# Patient Record
Sex: Female | Born: 1955 | Race: White | Hispanic: No | Marital: Married | Smoking: Never smoker
Health system: Southern US, Community
[De-identification: ages and names within clinical notes are randomized; demographics above are authoritative.]

## PROBLEM LIST (undated history)

## (undated) DIAGNOSIS — K219 Gastro-esophageal reflux disease without esophagitis: Secondary | ICD-10-CM

## (undated) DIAGNOSIS — E119 Type 2 diabetes mellitus without complications: Secondary | ICD-10-CM

## (undated) DIAGNOSIS — E890 Postprocedural hypothyroidism: Secondary | ICD-10-CM

## (undated) DIAGNOSIS — I10 Essential (primary) hypertension: Secondary | ICD-10-CM

## (undated) HISTORY — DX: Postprocedural hypothyroidism: E89.0

## (undated) HISTORY — PX: THYROIDECTOMY: SHX17

## (undated) HISTORY — DX: Type 2 diabetes mellitus without complications: E11.9

## (undated) HISTORY — DX: Essential (primary) hypertension: I10

## (undated) HISTORY — DX: Gastro-esophageal reflux disease without esophagitis: K21.9

---

## 2000-10-06 ENCOUNTER — Other Ambulatory Visit: Admission: RE | Admit: 2000-10-06 | Discharge: 2000-10-06 | Payer: Self-pay | Admitting: Obstetrics and Gynecology

## 2001-03-22 ENCOUNTER — Encounter: Payer: Self-pay | Admitting: *Deleted

## 2001-03-22 ENCOUNTER — Ambulatory Visit (HOSPITAL_COMMUNITY): Admission: RE | Admit: 2001-03-22 | Discharge: 2001-03-22 | Payer: Self-pay | Admitting: *Deleted

## 2001-10-17 ENCOUNTER — Other Ambulatory Visit: Admission: RE | Admit: 2001-10-17 | Discharge: 2001-10-17 | Payer: Self-pay | Admitting: Obstetrics and Gynecology

## 2002-10-23 ENCOUNTER — Other Ambulatory Visit: Admission: RE | Admit: 2002-10-23 | Discharge: 2002-10-23 | Payer: Self-pay | Admitting: Obstetrics and Gynecology

## 2003-07-20 ENCOUNTER — Encounter: Admission: RE | Admit: 2003-07-20 | Discharge: 2003-07-20 | Payer: Self-pay | Admitting: Obstetrics and Gynecology

## 2003-09-28 ENCOUNTER — Encounter: Admission: RE | Admit: 2003-09-28 | Discharge: 2003-09-28 | Payer: Self-pay | Admitting: Obstetrics and Gynecology

## 2003-10-16 ENCOUNTER — Encounter (HOSPITAL_COMMUNITY): Admission: RE | Admit: 2003-10-16 | Discharge: 2004-01-14 | Payer: Self-pay | Admitting: Obstetrics and Gynecology

## 2003-10-19 ENCOUNTER — Ambulatory Visit (HOSPITAL_COMMUNITY): Admission: RE | Admit: 2003-10-19 | Discharge: 2003-10-19 | Payer: Self-pay | Admitting: Obstetrics and Gynecology

## 2003-11-23 ENCOUNTER — Other Ambulatory Visit: Admission: RE | Admit: 2003-11-23 | Discharge: 2003-11-23 | Payer: Self-pay | Admitting: Obstetrics and Gynecology

## 2005-02-06 ENCOUNTER — Other Ambulatory Visit: Admission: RE | Admit: 2005-02-06 | Discharge: 2005-02-06 | Payer: Self-pay | Admitting: Obstetrics and Gynecology

## 2005-02-16 ENCOUNTER — Encounter: Admission: RE | Admit: 2005-02-16 | Discharge: 2005-02-16 | Payer: Self-pay | Admitting: Obstetrics and Gynecology

## 2005-09-18 ENCOUNTER — Encounter: Admission: RE | Admit: 2005-09-18 | Discharge: 2005-09-18 | Payer: Self-pay | Admitting: Internal Medicine

## 2006-02-17 ENCOUNTER — Encounter: Admission: RE | Admit: 2006-02-17 | Discharge: 2006-02-17 | Payer: Self-pay | Admitting: Obstetrics and Gynecology

## 2006-07-16 ENCOUNTER — Ambulatory Visit (HOSPITAL_COMMUNITY): Admission: RE | Admit: 2006-07-16 | Discharge: 2006-07-16 | Payer: Self-pay | Admitting: Neurosurgery

## 2007-03-03 ENCOUNTER — Encounter: Admission: RE | Admit: 2007-03-03 | Discharge: 2007-03-03 | Payer: Self-pay | Admitting: *Deleted

## 2008-03-06 ENCOUNTER — Encounter: Admission: RE | Admit: 2008-03-06 | Discharge: 2008-03-06 | Payer: Self-pay | Admitting: Obstetrics and Gynecology

## 2009-03-08 ENCOUNTER — Encounter: Admission: RE | Admit: 2009-03-08 | Discharge: 2009-03-08 | Payer: Self-pay | Admitting: Obstetrics and Gynecology

## 2010-03-19 ENCOUNTER — Encounter: Admission: RE | Admit: 2010-03-19 | Discharge: 2010-03-19 | Payer: Self-pay | Admitting: Obstetrics and Gynecology

## 2010-06-01 ENCOUNTER — Encounter: Payer: Self-pay | Admitting: Internal Medicine

## 2010-06-01 ENCOUNTER — Encounter: Payer: Self-pay | Admitting: Obstetrics and Gynecology

## 2011-02-17 ENCOUNTER — Other Ambulatory Visit: Payer: Self-pay | Admitting: Obstetrics and Gynecology

## 2011-02-17 DIAGNOSIS — Z1231 Encounter for screening mammogram for malignant neoplasm of breast: Secondary | ICD-10-CM

## 2011-03-23 ENCOUNTER — Ambulatory Visit
Admission: RE | Admit: 2011-03-23 | Discharge: 2011-03-23 | Disposition: A | Discharge status: Home / Self Care | Payer: BC Managed Care – PPO | Source: Ambulatory Visit | Attending: Obstetrics and Gynecology | Admitting: Obstetrics and Gynecology

## 2011-03-23 DIAGNOSIS — Z1231 Encounter for screening mammogram for malignant neoplasm of breast: Secondary | ICD-10-CM

## 2011-05-12 LAB — HM PAP SMEAR

## 2012-02-09 LAB — HM MAMMOGRAPHY

## 2012-03-11 ENCOUNTER — Other Ambulatory Visit: Payer: Self-pay | Admitting: Obstetrics and Gynecology

## 2012-03-11 DIAGNOSIS — Z1231 Encounter for screening mammogram for malignant neoplasm of breast: Secondary | ICD-10-CM

## 2012-03-24 ENCOUNTER — Ambulatory Visit
Admission: RE | Admit: 2012-03-24 | Discharge: 2012-03-24 | Disposition: A | Discharge status: Home / Self Care | Payer: BC Managed Care – PPO | Source: Ambulatory Visit | Attending: Obstetrics and Gynecology | Admitting: Obstetrics and Gynecology

## 2012-03-24 DIAGNOSIS — Z1231 Encounter for screening mammogram for malignant neoplasm of breast: Secondary | ICD-10-CM

## 2012-09-26 ENCOUNTER — Ambulatory Visit: Payer: BC Managed Care – PPO | Admitting: Family Medicine

## 2012-09-26 LAB — LIPID PANEL
Cholesterol: 182 mg/dL (ref 0–200)
HDL: 31 mg/dL — ABNORMAL LOW (ref >39)
Total CHOL/HDL Ratio: 5.9 Ratio
Triglycerides: 522 mg/dL — ABNORMAL HIGH (ref <150)

## 2012-09-26 LAB — T4, FREE: Free T4: 1.3 ng/dL (ref 0.80–1.80)

## 2012-09-26 LAB — COMPLETE METABOLIC PANEL WITH GFR
ALT: 36 U/L — ABNORMAL HIGH (ref 0–35)
AST: 33 U/L (ref 0–37)
Albumin: 4.3 g/dL (ref 3.5–5.2)
Alkaline Phosphatase: 95 U/L (ref 39–117)
BUN: 10 mg/dL (ref 6–23)
CO2: 29 mEq/L (ref 19–32)
Calcium: 9.7 mg/dL (ref 8.4–10.5)
Chloride: 102 mEq/L (ref 96–112)
Creat: 0.66 mg/dL (ref 0.50–1.10)
GFR, Est African American: >89 mL/min
GFR, Est Non African American: >89 mL/min
Glucose, Bld: 146 mg/dL — ABNORMAL HIGH (ref 70–99)
Potassium: 4.2 mEq/L (ref 3.5–5.3)
Sodium: 138 mEq/L (ref 135–145)
Total Bilirubin: 0.5 mg/dL (ref 0.3–1.2)
Total Protein: 7.1 g/dL (ref 6.0–8.3)

## 2012-09-26 LAB — CBC WITH DIFFERENTIAL/PLATELET
Basophils Absolute: 0.1 10*3/uL (ref 0.0–0.1)
Basophils Relative: 1 % (ref 0–1)
Eosinophils Absolute: 1.1 10*3/uL — ABNORMAL HIGH (ref 0.0–0.7)
Eosinophils Relative: 12 % — ABNORMAL HIGH (ref 0–5)
HCT: 37 % (ref 36.0–46.0)
Hemoglobin: 13.1 g/dL (ref 12.0–15.0)
Lymphocytes Relative: 38 % (ref 12–46)
Lymphs Abs: 3.2 10*3/uL (ref 0.7–4.0)
MCH: 30.3 pg (ref 26.0–34.0)
MCHC: 35.4 g/dL (ref 30.0–36.0)
MCV: 85.6 fL (ref 78.0–100.0)
Monocytes Absolute: 0.6 10*3/uL (ref 0.1–1.0)
Monocytes Relative: 8 % (ref 3–12)
Neutro Abs: 3.6 10*3/uL (ref 1.7–7.7)
Neutrophils Relative %: 41 % — ABNORMAL LOW (ref 43–77)
Platelets: 308 10*3/uL (ref 150–400)
RBC: 4.32 MIL/uL (ref 3.87–5.11)
RDW: 13.4 % (ref 11.5–15.5)
WBC: 8.6 10*3/uL (ref 4.0–10.5)

## 2012-09-26 LAB — T3, FREE: T3, Free: 2.5 pg/mL (ref 2.3–4.2)

## 2012-09-26 LAB — TSH: TSH: 0.764 u[IU]/mL (ref 0.350–4.500)

## 2012-09-26 LAB — HEMOGLOBIN A1C
Hgb A1c MFr Bld: 6.7 % — ABNORMAL HIGH (ref <5.7)
Mean Plasma Glucose: 146 mg/dL — ABNORMAL HIGH (ref <117)

## 2012-09-26 LAB — MAGNESIUM: Magnesium: 2.2 mg/dL (ref 1.5–2.5)

## 2012-09-26 NOTE — Progress Notes (Signed)
Patient ID: Diana Mayer, female   DOB: 1955-05-16, 57 y.o.   MRN: [id number]   [name] is coming in for labs then we'll do her follow up.

## 2012-10-17 ENCOUNTER — Other Ambulatory Visit: Payer: Self-pay | Admitting: Family Medicine

## 2012-10-17 MED ORDER — ICOSAPENT ETHYL 1 G PO CAPS: 2.0000 | ORAL_CAPSULE | Freq: Two times a day (BID) | ORAL | Status: DC

## 2012-11-28 ENCOUNTER — Other Ambulatory Visit: Payer: Self-pay | Admitting: Family Medicine

## 2012-11-28 DIAGNOSIS — IMO0001 Reserved for inherently not codable concepts without codable children: Secondary | ICD-10-CM

## 2012-11-28 MED ORDER — CANAGLIFLOZIN 300 MG PO TABS: 300.0000 mg | ORAL_TABLET | Freq: Every day | ORAL | Status: DC

## 2012-11-28 MED ORDER — SITAGLIP PHOS-METFORMIN HCL ER 100-1000 MG PO TB24: 1.0000 | ORAL_TABLET | Freq: Every day | ORAL | Status: DC

## 2013-03-24 ENCOUNTER — Other Ambulatory Visit: Payer: Self-pay

## 2013-03-24 DIAGNOSIS — Z1231 Encounter for screening mammogram for malignant neoplasm of breast: Secondary | ICD-10-CM

## 2013-04-13 ENCOUNTER — Encounter: Payer: Self-pay | Admitting: *Deleted

## 2013-04-26 ENCOUNTER — Ambulatory Visit: Payer: BC Managed Care – PPO

## 2013-05-01 ENCOUNTER — Ambulatory Visit
Admission: RE | Admit: 2013-05-01 | Discharge: 2013-05-01 | Disposition: A | Discharge status: Home / Self Care | Payer: BC Managed Care – PPO | Source: Ambulatory Visit

## 2013-05-01 DIAGNOSIS — Z1231 Encounter for screening mammogram for malignant neoplasm of breast: Secondary | ICD-10-CM

## 2013-05-17 ENCOUNTER — Other Ambulatory Visit: Payer: Self-pay | Admitting: Family Medicine

## 2013-08-01 ENCOUNTER — Encounter: Payer: Self-pay | Admitting: *Deleted

## 2013-08-01 ENCOUNTER — Other Ambulatory Visit: Payer: Self-pay | Admitting: *Deleted

## 2013-08-01 DIAGNOSIS — I10 Essential (primary) hypertension: Secondary | ICD-10-CM

## 2013-08-01 DIAGNOSIS — K219 Gastro-esophageal reflux disease without esophagitis: Secondary | ICD-10-CM

## 2013-08-01 DIAGNOSIS — E119 Type 2 diabetes mellitus without complications: Secondary | ICD-10-CM

## 2013-08-01 DIAGNOSIS — E663 Overweight: Secondary | ICD-10-CM

## 2013-08-01 DIAGNOSIS — E1165 Type 2 diabetes mellitus with hyperglycemia: Principal | ICD-10-CM

## 2013-08-01 DIAGNOSIS — R5381 Other malaise: Secondary | ICD-10-CM

## 2013-08-01 DIAGNOSIS — IMO0001 Reserved for inherently not codable concepts without codable children: Secondary | ICD-10-CM

## 2013-08-01 DIAGNOSIS — E785 Hyperlipidemia, unspecified: Secondary | ICD-10-CM

## 2013-08-01 DIAGNOSIS — E890 Postprocedural hypothyroidism: Secondary | ICD-10-CM

## 2013-08-01 DIAGNOSIS — R5383 Other fatigue: Secondary | ICD-10-CM

## 2013-08-01 DIAGNOSIS — E039 Hypothyroidism, unspecified: Secondary | ICD-10-CM

## 2013-08-01 DIAGNOSIS — G4483 Primary cough headache: Secondary | ICD-10-CM | POA: Insufficient documentation

## 2013-08-01 HISTORY — DX: Gastro-esophageal reflux disease without esophagitis: K21.9

## 2013-08-01 HISTORY — DX: Postprocedural hypothyroidism: E89.0

## 2013-08-01 HISTORY — DX: Essential (primary) hypertension: I10

## 2013-08-01 HISTORY — DX: Type 2 diabetes mellitus without complications: E11.9

## 2013-08-02 ENCOUNTER — Other Ambulatory Visit: Payer: BC Managed Care – PPO

## 2013-08-17 ENCOUNTER — Ambulatory Visit: Payer: BC Managed Care – PPO | Admitting: Family Medicine

## 2013-08-18 ENCOUNTER — Other Ambulatory Visit: Payer: Self-pay | Admitting: Family Medicine

## 2013-08-19 ENCOUNTER — Other Ambulatory Visit: Payer: Self-pay | Admitting: Family Medicine

## 2013-08-19 DIAGNOSIS — IMO0001 Reserved for inherently not codable concepts without codable children: Secondary | ICD-10-CM

## 2013-08-19 DIAGNOSIS — I10 Essential (primary) hypertension: Secondary | ICD-10-CM

## 2013-08-19 DIAGNOSIS — E1165 Type 2 diabetes mellitus with hyperglycemia: Principal | ICD-10-CM

## 2013-08-19 MED ORDER — NADOLOL 20 MG PO TABS: 20.0000 mg | ORAL_TABLET | Freq: Every day | ORAL | Status: DC

## 2013-08-19 MED ORDER — SITAGLIP PHOS-METFORMIN HCL ER 100-1000 MG PO TB24: 1.0000 | ORAL_TABLET | Freq: Every day | ORAL | Status: DC

## 2013-08-22 ENCOUNTER — Ambulatory Visit: Payer: BC Managed Care – PPO | Admitting: Family Medicine

## 2013-08-30 ENCOUNTER — Other Ambulatory Visit: Payer: BC Managed Care – PPO

## 2013-08-30 LAB — CBC WITH DIFFERENTIAL/PLATELET
Basophils Absolute: 0.1 10*3/uL (ref 0.0–0.1)
Basophils Relative: 1 % (ref 0–1)
Eosinophils Absolute: 1.1 10*3/uL — ABNORMAL HIGH (ref 0.0–0.7)
Eosinophils Relative: 12 % — ABNORMAL HIGH (ref 0–5)
HCT: 39.6 % (ref 36.0–46.0)
Hemoglobin: 14.6 g/dL (ref 12.0–15.0)
Lymphocytes Relative: 32 % (ref 12–46)
Lymphs Abs: 2.8 10*3/uL (ref 0.7–4.0)
MCH: 30.6 pg (ref 26.0–34.0)
MCHC: 36.9 g/dL — ABNORMAL HIGH (ref 30.0–36.0)
MCV: 83 fL (ref 78.0–100.0)
Monocytes Absolute: 0.7 10*3/uL (ref 0.1–1.0)
Monocytes Relative: 8 % (ref 3–12)
Neutro Abs: 4.2 10*3/uL (ref 1.7–7.7)
Neutrophils Relative %: 47 % (ref 43–77)
Platelets: 326 10*3/uL (ref 150–400)
RBC: 4.77 MIL/uL (ref 3.87–5.11)
RDW: 13.1 % (ref 11.5–15.5)
WBC: 8.9 10*3/uL (ref 4.0–10.5)

## 2013-08-30 LAB — COMPLETE METABOLIC PANEL WITH GFR
ALT: 20 U/L (ref 0–35)
AST: 25 U/L (ref 0–37)
Albumin: 4.3 g/dL (ref 3.5–5.2)
Alkaline Phosphatase: 80 U/L (ref 39–117)
BUN: 13 mg/dL (ref 6–23)
CO2: 26 mEq/L (ref 19–32)
Calcium: 9.2 mg/dL (ref 8.4–10.5)
Chloride: 103 mEq/L (ref 96–112)
Creat: 0.8 mg/dL (ref 0.50–1.10)
GFR, Est African American: >89 mL/min
GFR, Est Non African American: 82 mL/min
Glucose, Bld: 127 mg/dL — ABNORMAL HIGH (ref 70–99)
Potassium: 4.4 mEq/L (ref 3.5–5.3)
Sodium: 138 mEq/L (ref 135–145)
Total Bilirubin: 0.8 mg/dL (ref 0.2–1.2)
Total Protein: 6.9 g/dL (ref 6.0–8.3)

## 2013-08-30 LAB — LIPID PANEL
Cholesterol: 163 mg/dL (ref 0–200)
HDL: 35 mg/dL — ABNORMAL LOW (ref >39)
LDL Cholesterol: 66 mg/dL (ref 0–99)
Total CHOL/HDL Ratio: 4.7 Ratio
Triglycerides: 311 mg/dL — ABNORMAL HIGH (ref <150)
VLDL: 62 mg/dL — ABNORMAL HIGH (ref 0–40)

## 2013-08-30 LAB — T4, FREE: Free T4: 1.36 ng/dL (ref 0.80–1.80)

## 2013-08-30 LAB — TSH: TSH: 0.924 u[IU]/mL (ref 0.350–4.500)

## 2013-08-30 LAB — HEMOGLOBIN A1C
Hgb A1c MFr Bld: 6.9 % — ABNORMAL HIGH (ref <5.7)
Mean Plasma Glucose: 151 mg/dL — ABNORMAL HIGH (ref <117)

## 2013-09-06 ENCOUNTER — Encounter (INDEPENDENT_AMBULATORY_CARE_PROVIDER_SITE_OTHER): Payer: Self-pay

## 2013-09-06 ENCOUNTER — Ambulatory Visit (INDEPENDENT_AMBULATORY_CARE_PROVIDER_SITE_OTHER): Payer: BC Managed Care – PPO | Admitting: Family Medicine

## 2013-09-06 ENCOUNTER — Encounter: Payer: Self-pay | Admitting: Family Medicine

## 2013-09-06 VITALS — BP 124/85 | HR 78 | Resp 16 | Wt 162.0 lb

## 2013-09-06 DIAGNOSIS — E039 Hypothyroidism, unspecified: Secondary | ICD-10-CM

## 2013-09-06 DIAGNOSIS — I10 Essential (primary) hypertension: Secondary | ICD-10-CM

## 2013-09-06 DIAGNOSIS — E119 Type 2 diabetes mellitus without complications: Secondary | ICD-10-CM

## 2013-09-06 DIAGNOSIS — E781 Pure hyperglyceridemia: Secondary | ICD-10-CM

## 2013-09-06 MED ORDER — FENOFIBRATE MICRONIZED 130 MG PO CAPS: 130.0000 mg | ORAL_CAPSULE | Freq: Every day | ORAL | Status: AC

## 2013-09-06 MED ORDER — SITAGLIPTIN-METFORMIN HCL ER 50-1000 MG PO TB24
1.0000 | ORAL_TABLET | Freq: Two times a day (BID) | ORAL | Status: AC
Start: 2013-09-06 — End: 2014-09-07

## 2013-09-06 MED ORDER — OLMESARTAN-AMLODIPINE-HCTZ 40-5-25 MG PO TABS: ORAL_TABLET | ORAL | Status: DC

## 2013-09-06 MED ORDER — ICOSAPENT ETHYL 1 G PO CAPS: 2.0000 | ORAL_CAPSULE | Freq: Two times a day (BID) | ORAL | Status: DC

## 2013-09-06 MED ORDER — NADOLOL 20 MG PO TABS: 20.0000 mg | ORAL_TABLET | Freq: Every day | ORAL | Status: AC

## 2013-09-06 MED ORDER — LEVOTHYROXINE SODIUM 75 MCG PO TABS: 75.0000 ug | ORAL_TABLET | Freq: Every day | ORAL | Status: AC

## 2013-09-06 NOTE — Patient Instructions (Addendum)
1                                                                                                                                                                                                       )  BP - Perfect on your current dosage of Tribenzor and nadolol.      2)  Blood Sugar - Stop the Invokana and then finish up the Janumet XR 100/1000 then switch to the Janumet XR 50/1000 t wice a day.  You might consider reading the book by Dr. Monico HoarJoel Fuhrman "The End of Diabetes".      3 )  Cholesterol - Continue on the Vascepa 2 per day and add the fenofibrate with breakfast.  We'll recheck your labs in 6   months.     4)  Hypothyroidism - Your number is perfect on your current dosage so remain on it.     5 )  GERD - Add some Pepcid 20 - 40 mg or Zantac 150 - 300 mg to your Tums as needed.          Diabetes and Exercise Exercising regularly is impor tant. It is not just about losing weight. It has many health benefits, such as:  Improving your overall   fitness, flexibility, and endurance.  Increasing your bone  density.  Helping with weight c ontrol.  Decreasing your bod  y fat.  Increasing your mus cle strength.  Reducing stress an  d tension.  Improving your over all health. People with diabetes w  ho exercise gain additional benefits because exercise:  Reduces appetite.   Improves the body's use of  blood sugar (glucose).  Helps lower or control blo  od glucose.  Decreases blood pressur e.  Helps control blood lipids              Shaking.  Sweating.  Chills.  Conf   Drink plenty of water while you exercise to prevent dehydration or heat stroke. Body water is lost during exercise and must be replaced.  Talk to your health care provider before starting an exercise program to make sure it is safe for  you. Remember, almost any type of activity is better than none. Document Released: 07/18/2003 Document Revised: 12/28/2012 Document Reviewed: 10/04/2012 [facility] Patient Information 2014 [place].

## 2013-09-06 NOTE — Progress Notes (Signed)
Subjective:    Patient ID: Diana Mayer, female    DOB: Jan 06, 1956, 58 y.o.   MRN: [id number]  HPI   Diana Mayer is here for a follow up on Diana Mayer medications and most recent lab results.  1)  Hypothyroidism - Diana Mayer is feeling fine on Diana Mayer current dosage of 75 mcg.  Diana Mayer can not tell a difference between the generic levothyroxine vs Synthroid.  Diana Mayer needs a refill for the levothyroxine.    2)   Type II DM -  Diana Mayer is taking a combination of Invokana (1/2 of the 300 mg) and Janumet (100/1000 mg) daily.  Diana Mayer really does not like the Invokana because it give Diana Mayer vaginal irritation.    3)  Hypertension -  Diana Mayer blood pressure is controlled with the combination of Nadolol (20 mg daily) and Tribenzor (40-5-25 mg) 1/2 tab daily.   4)  Headaches - Diana Mayer says that they are much better than they were 1 1/2 years ago but Diana Mayer still has them.     Review of Systems  Constitutional: Negative for activity change, appetite change, fatigue and unexpected weight change.  HENT: Negative.   Eyes: Negative.   Respiratory: Negative for chest tightness and shortness of breath.   Cardiovascular: Negative for chest pain, palpitations and leg swelling.  Gastrointestinal: Negative for diarrhea and constipation.  Endocrine: Negative.  Negative for polydipsia, polyphagia and polyuria.  Genitourinary: Negative for urgency, frequency and difficulty urinating.  Musculoskeletal: Negative.   Skin: Negative.   Neurological: Negative.  Negative for light-headedness and numbness.  Hematological: Negative for adenopathy. Does not bruise/bleed easily.  Psychiatric/Behavioral: Negative for sleep disturbance and dysphoric mood. The patient is not nervous/anxious.        Diana Mayer has been very stressed this past year due to the unexpected death of Diana Mayer brother and Diana Mayer mother's failing health.       Past Medical History  Diagnosis Date  . Thyroid disease     HYPOTHYROIDISM  . Hypertension   . Diabetes mellitus without complication       Past Surgical History  Procedure Laterality Date  . 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 Thyroid disease Mother   . Cancer Maternal Aunt     Breast Cancer  . Cancer Maternal Grandmother     Breast Cancer  . Diabetes Paternal Grandmother   . Heart disease Brother     Allergies  Allergen Reactions  . Ace Inhibitors Cough     Immunization History  Administered Date(s) Administered  . Tdap 07/30/2011       Objective:   Physical Exam  Nursing note and vitals reviewed. Constitutional: Diana Mayer is oriented to person, place, and time.  Eyes: Conjunctivae are normal. No scleral icterus.  Neck: Neck supple. No thyromegaly present.  Cardiovascular: Normal rate, regular rhythm and normal heart sounds.   Pulmonary/Chest: Effort normal and breath sounds normal.  Musculoskeletal: Diana Mayer exhibits  no edema and no tenderness.  Neurological: Diana Mayer is alert and oriented to person, place, and time.  Skin: Skin is warm and dry.  Psychiatric: Diana Mayer has a normal mood and affect. Diana Mayer behavior is normal. Judgment and thought content normal.      Assessment & Plan:   Diana Mayer was seen today for medication refill.  Diagnoses and associated orders for this visit:  Type II or unspecified type diabetes mellitus without mention of complication, not stated as uncontrolled Comments: Diana Mayer A1c has increased from Diana Mayer last visit.  We discussed a couple of options for Diana Mayer to get better control of Diana Mayer blood sugar. Since Diana Mayer does not like the  Invokana, we'll stop this medication and increase Diana Mayer Janumet XR to 50/1000 mg twice a day.  We'll recheck Diana Mayer A1c in 6 months.  Diana Mayer might consider Victoza or Trulicity in 6 months if Diana Mayer A1c does not improve.    - SitaGLIPtin-MetFORMIN HCl (JANUMET XR) 50-1000 MG TB24; Take 1 tablet by mouth 2 (two) times daily.  Essential hypertension, benign Comments: Diana Mayer BP is perfect on Diana Mayer current combination of medications.   - Olmesartan-Amlodipine-HCTZ (TRIBENZOR) 40-5-25 MG TABS; Take 1/2 - 1 tab po daily - nadolol (CORGARD) 20 MG tablet; Take 1 tablet (20 mg total) by mouth daily.  Unspecified hypothyroidism Comments: Diana Mayer feels well on Diana Mayer current dosage of levothyroxine so Diana Mayer'll remain on Diana Mayer current dosage of 75 mcg.   - levothyroxine (SYNTHROID, LEVOTHROID) 75 MCG tablet; Take 1 tablet (75 mcg total) by mouth daily before breakfast.  High triglycerides Comments: Diana Mayer triglycerides are lower on the Vescepa but are still elevated so Diana Mayer is going to start on Antara.   - Icosapent Ethyl 1 G CAPS; Take 2 capsules by mouth 2 (two) times daily with a meal. - fenofibrate micronized (ANTARA) 130 MG capsule; Take 1 capsule (130 mg total) by mouth daily before breakfast.  TIME SPENT "FACE TO FACE" WITH PATIENT -  30 MINS

## 2013-10-10 ENCOUNTER — Encounter: Payer: Self-pay | Admitting: *Deleted

## 2013-10-17 ENCOUNTER — Encounter: Payer: Self-pay | Admitting: Cardiology

## 2013-10-17 ENCOUNTER — Ambulatory Visit (INDEPENDENT_AMBULATORY_CARE_PROVIDER_SITE_OTHER): Payer: BC Managed Care – PPO | Admitting: Cardiology

## 2013-10-17 VITALS — BP 126/76 | HR 80 | Ht 63.0 in | Wt 165.0 lb

## 2013-10-17 DIAGNOSIS — R0989 Other specified symptoms and signs involving the circulatory and respiratory systems: Secondary | ICD-10-CM

## 2013-10-17 DIAGNOSIS — E663 Overweight: Secondary | ICD-10-CM

## 2013-10-17 DIAGNOSIS — Z8249 Family history of ischemic heart disease and other diseases of the circulatory system: Secondary | ICD-10-CM

## 2013-10-17 DIAGNOSIS — R079 Chest pain, unspecified: Secondary | ICD-10-CM

## 2013-10-17 DIAGNOSIS — E890 Postprocedural hypothyroidism: Secondary | ICD-10-CM

## 2013-10-17 DIAGNOSIS — R0609 Other forms of dyspnea: Secondary | ICD-10-CM

## 2013-10-17 DIAGNOSIS — E785 Hyperlipidemia, unspecified: Secondary | ICD-10-CM

## 2013-10-17 DIAGNOSIS — I10 Essential (primary) hypertension: Secondary | ICD-10-CM

## 2013-10-17 NOTE — Patient Instructions (Addendum)
Your physician recommends that you continue on your current medications as directed. Please refer to the Current Medication list given to you today.  Your physician has requested that you have en exercise stress myoview. For further information please visit [email or web address] Please follow instruction sheet, as given.  You have been referred to [facility] PharmD here in our office-Lipid Clinic  Your physician recommends that you schedule a follow-up appointment in: PENDING YOUR TEST RESULTS

## 2013-10-17 NOTE — Progress Notes (Signed)
Patient ID: Diana Mayer, female   DOB: Aug 28, 1955, 58 y.o.   MRN: [id number]    Patient Name: Diana CopierKathy S Mayer Date of Encounter: 10/17/2013  Primary Care Provider:  Birdena JubileeZANARD, ROBYN, MD Primary Cardiologist:  Lars MassonKatarina H Manley Fason  Problem List   Past Medical History  Diagnosis Date  . Essential hypertension, benign 08/01/2013  . Esophageal reflux 08/01/2013  . Postsurgical hypothyroidism 08/01/2013  . Type II or unspecified type diabetes mellitus without mention of complication, not stated as uncontrolled 08/01/2013   Past Surgical History  Procedure Laterality Date  . Thyroidectomy      Allergies  Allergies  Allergen Reactions  . Ace Inhibitors Cough    HPI  58 year old female with prior medical history of known insulin-dependent diabetes mellitus, hyperlipidemia and hypertension, hypothyroidism and a significant family history of premature coronary artery disease who is coming with concerns of dyspnea on exertion chest pressure and overall fatigue that has been progressively worsening over the last couple months. The patient is specially on concerned since her father died of myocardial or infarction at age of 59 and her aunt had myocardial infarctions at age of 59. She has been recently started on fenofibrate for findings of triglycerides of over 300. Patient states that her chest pressure is retrosternal exertional associated shortness of breath and fatigue she denies any palpitations or syncope. She also denies any orthopnea paroxysmal nocturnal dyspnea or lower extremity edema.  Home Medications  Prior to Admission medications   Medication Sig Start Date End Date Taking? Authorizing Provider  fenofibrate micronized (ANTARA) 130 MG capsule Take 1 capsule (130 mg total) by mouth daily before breakfast. 09/06/13 09/07/14 Yes Gillian Scarceobyn K Zanard, MD  Icosapent Ethyl 1 G CAPS Take 2 capsules by mouth 2 (two) times daily with a meal. 09/06/13 09/07/14 Yes Gillian Scarceobyn K Zanard, MD  levothyroxine  (SYNTHROID, LEVOTHROID) 75 MCG tablet Take 1 tablet (75 mcg total) by mouth daily before breakfast. 09/06/13 09/07/14 Yes Gillian Scarceobyn K Zanard, MD  Olmesartan-Amlodipine-HCTZ [facility](TRIBENZOR) 40-5-25 MG TABS Take 1/2 - 1 tab po daily 09/06/13 09/07/14 Yes Gillian Scarceobyn K Zanard, MD  SitaGLIPtin-MetFORMIN HCl (JANUMET XR) 50-1000 MG TB24 Take 1 tablet by mouth 2 (two) times daily. 09/06/13 09/07/14 Yes Gillian Scarceobyn K Zanard, MD  nadolol (CORGARD) 20 MG tablet Take 1 tablet (20 mg total) by mouth daily. 09/06/13 10/06/13  Gillian Scarceobyn K Zanard, MD    Family History  Family History  Problem Relation Age of Onset  . Thyroid disease Mother   . Breast cancer Maternal Aunt   . Breast cancer Maternal Grandmother   . Diabetes Paternal Grandmother   . Heart disease Brother     Cardiac arrest    Social History  History   Social History  . Marital Status: Married    Spouse Name: N/A    Number of Children: 3  . Years of Education: N/A   Occupational History  . 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 Smoking status: Never Smoker   . Smokeless tobacco: Never Used  . Alcohol Use: No  . Drug Use: No  . Sexual Activity: Yes    Partners: Male   Other Topics Concern  . 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  Cardiovascular:  No chest pain, dyspnea on exertion, edema, orthopnea, palpitations, paroxysmal nocturnal dyspnea. Dermatological: No rash,  lesions/masses Respiratory: No cough, dyspnea Urologic: No hematuria, dysuria Abdominal:   No nausea, vomiting, diarrhea, bright red blood per rectum, melena, or hematemesis Neurologic:  No visual changes, wkns, changes in mental status. All other systems reviewed and are otherwise negative except as noted above.  Physical Exam  Blood pressure 126/76, pulse 80, height 5\' 3"  (1.6 m), weight 165 lb (74.844 kg).  General: Pleasant, NAD Psych: Normal affect. Neuro: Alert and oriented X 3. Moves all extremities spontaneously. HEENT: Normal  Neck: Supple without bruits or JVD. Lungs:  Resp regular and unlabored, CTA. Heart: RRR no s3, s4, or murmurs. Abdomen: Soft, non-tender, non-distended, BS + x 4.  Extremities: No clubbing, cyanosis or edema. DP/PT/Radials 2+ and equal bilaterally.  Labs:  No results found for this basename: CKTOTAL, CKMB, TROPONINI,  in the last 72 hours Lab Results  Component Value Date   WBC 8.9 08/30/2013   HGB 14.6 08/30/2013   HCT 39.6 08/30/2013   MCV 83.0 08/30/2013   PLT 326 08/30/2013    No results found for this basename: DDIMER   No components found with this basename: POCBNP,     Component Value Date/Time   NA 138 08/30/2013 0837   K 4.4 08/30/2013 0837   CL 103 08/30/2013 0837   CO2 26 08/30/2013 0837   GLUCOSE 127* 08/30/2013 0837   BUN 13 08/30/2013 0837   CREATININE 0.80 08/30/2013 0837   CALCIUM 9.2 08/30/2013 0837   PROT 6.9 08/30/2013 0837   ALBUMIN 4.3 08/30/2013 0837   AST 25 08/30/2013 0837   ALT 20 08/30/2013 0837   ALKPHOS 80 08/30/2013 0837   BILITOT 0.8 08/30/2013 0837   GFRNONAA 82 08/30/2013 0837   GFRAA >89 08/30/2013 0837   Lab Results  Component Value Date   CHOL 163 08/30/2013   HDL 35* 08/30/2013   LDLCALC 66 08/30/2013   TRIG 311* 08/30/2013    Accessory Clinical Findings  echocardiogram  ECG - SR, normal ECG    Assessment & Plan  58 year old female  1. exertional chest pain and dyspnea - with significant risk factors  that include premature coronary artery disease in multiple family members, non-insulin-dependent diabetes mellitus hypertension and significant hyperlipidemia. We will proceed with exercise nuclear stress test to rule out any ischemia. In case her stress test is negative it is crucial that patient with these risk factors specifically diabetes mellitus his father risk stratified for presence of any plaque in order to be able to tailor her lipid management.  2. Hyperlipidemia - triglycerides 311, recently started on fenofibrate, we'll refer the patient for further management to our lipid clinic.  3. blood pressure - well controlled  Followup in 6 weeks  Lars Masson, MD, [facility] 10/17/2013, 2:55 PM

## 2013-10-18 DIAGNOSIS — Z8249 Family history of ischemic heart disease and other diseases of the circulatory system: Secondary | ICD-10-CM | POA: Insufficient documentation

## 2013-10-18 DIAGNOSIS — E785 Hyperlipidemia, unspecified: Secondary | ICD-10-CM | POA: Insufficient documentation

## 2013-10-23 ENCOUNTER — Telehealth: Payer: Self-pay

## 2013-10-23 NOTE — Telephone Encounter (Signed)
[name] called to check on her synthroid refill, [facility] said they had sent it to us three different times, I gave her our fax number so she could make sure they have right number on file.

## 2013-10-31 ENCOUNTER — Ambulatory Visit: Payer: BC Managed Care – PPO | Admitting: Pharmacist

## 2013-11-01 ENCOUNTER — Ambulatory Visit (HOSPITAL_COMMUNITY): Payer: BC Managed Care – PPO | Attending: Cardiology | Admitting: Radiology

## 2013-11-01 VITALS — BP 115/84 | HR 78 | Ht 63.0 in | Wt 162.0 lb

## 2013-11-01 DIAGNOSIS — E890 Postprocedural hypothyroidism: Secondary | ICD-10-CM

## 2013-11-01 DIAGNOSIS — R0789 Other chest pain: Secondary | ICD-10-CM | POA: Insufficient documentation

## 2013-11-01 DIAGNOSIS — R079 Chest pain, unspecified: Secondary | ICD-10-CM

## 2013-11-01 DIAGNOSIS — R0609 Other forms of dyspnea: Secondary | ICD-10-CM | POA: Insufficient documentation

## 2013-11-01 DIAGNOSIS — E663 Overweight: Secondary | ICD-10-CM

## 2013-11-01 DIAGNOSIS — R0602 Shortness of breath: Secondary | ICD-10-CM

## 2013-11-01 DIAGNOSIS — R0989 Other specified symptoms and signs involving the circulatory and respiratory systems: Secondary | ICD-10-CM | POA: Insufficient documentation

## 2013-11-01 DIAGNOSIS — I10 Essential (primary) hypertension: Secondary | ICD-10-CM

## 2013-11-01 MED ORDER — TECHNETIUM TC 99M SESTAMIBI GENERIC - CARDIOLITE
11.0000 | Freq: Once | INTRAVENOUS | Status: AC | PRN
  Administered 2013-11-01: 11 via INTRAVENOUS

## 2013-11-01 MED ORDER — TECHNETIUM TC 99M SESTAMIBI GENERIC - CARDIOLITE
33.0000 | Freq: Once | INTRAVENOUS | Status: AC | PRN
  Administered 2013-11-01: 33 via INTRAVENOUS

## 2013-11-01 NOTE — Progress Notes (Signed)
[facility] SITE 3 NUCLEAR MED [address] [place], [place] [address] [phone]    Cardiology Nuclear Med Study  Toma CopierKathy S Gurney is a 58 y.o. female     MRN : [id number]     DOB: 1956/01/24  Procedure Date: 11/01/2013  Nuclear Med Background Indication for Stress Test:  Evaluation for Ischemia History: No prior known history of CAD  Cardiac Risk Factors: Family History - CAD, Hypertension, Lipids and NIDDM  Symptoms: Chest Pressure with exertion (last occurrence one month ago), DOE   Nuclear Pre-Procedure Caffeine/Decaff Intake:  8:00pm NPO After: 8:00pm   Lungs:  clear O2 Sat: 98% on room air. IV 0.9% NS with Angio Cath:  22g  IV Site: R Antecubital x 1, tolerated well IV Started by:  Irean HongPatsy Edwards, RN  Chest Size (in):  36 Cup Size: B  Height: 5\' 3"  (1.6 m)  Weight:  162 lb (73.483 kg)  BMI:  Body mass index is 28.7 kg/(m^2). Tech Comments:  Patient held po diabetic medication today. Irean HongPatsy Edwards, RN.    Nuclear Med Study 1 or 2 day study: 1 day  Stress Test Type:  Stress  Reading MD: N/A  Order Authorizing Provider:  Tobias AlexanderKatarina Nelson, MD  Resting Radionuclide: Technetium 4636m Sestamibi  Resting Radionuclide Dose: 11.0 mCi   Stress Radionuclide:  Technetium 2036m Sestamibi  Stress Radionuclide Dose: 33.0 mCi           Stress Protocol Rest HR: 78 Stress HR: 153  Rest BP: 115/84 Stress BP: 206/80  Exercise Time (min): 9:15 METS: 10.5   Predicted Max HR: 161 bpm % Max HR: 95.03 bpm Rate Pressure Product: [id number]   Dose of Adenosine (mg):  n/a Dose of Lexiscan: n/a mg  Dose of Atropine (mg): n/a Dose of Dobutamine: n/a mcg/kg/min (at max HR)  Stress Test Technologist: Irean HongPatsy Edwards, RN  Nuclear Technologist:  Doyne Keelonya Yount, CNMT     Rest Procedure:  Myocardial perfusion imaging was performed at rest 45 minutes following the intravenous administration of Technetium 3536m Sestamibi. Rest ECG: NSR - Normal EKG  Stress Procedure:  The patient exercised on the  treadmill utilizing the Bruce Protocol for 9:15 minutes, RPE=15. The patient stopped due to DOE and denied any chest pain.  There was a hypertensive response to exercise. Technetium 3836m Sestamibi was injected at peak exercise and myocardial perfusion imaging was performed after a brief delay. Stress ECG: Insignificant upsloping ST segment depression.  QPS Raw Data Images:  Normal; no motion artifact; normal heart/lung ratio. Stress Images:  Small fixed distal anteroseptal artifact Rest Images:  Small fixed distal anteroseptal artifact Subtraction (SDS):  No evidence of ischemia. Transient Ischemic Dilatation (Normal <1.22):  0.89 Lung/Heart Ratio (Normal <0.45):  0.25  Quantitative Gated Spect Images QGS EDV:  64 ml QGS ESV:  21 ml  Impression Exercise Capacity:  Excellent exercise capacity. BP Response:  Hypertensive blood pressure response. Clinical Symptoms:  There is dyspnea. ECG Impression:  Insignificant upsloping ST segment depression. Comparison with Prior Nuclear Study: No previous nuclear study performed  Overall Impression:  Low risk stress nuclear study with small fixed distal anteroseptal defect, likely artifact. No ischemia.  LV Ejection Fraction: 68%.  LV Wall Motion:  NL LV Function; NL Wall Motion  Chrystie NoseKenneth C. Hilty, MD, [facility] Center[facility]ACC Board Certified in Nuclear Cardiology Attending Cardiologist [facility]

## 2013-11-07 ENCOUNTER — Telehealth: Payer: Self-pay | Admitting: *Deleted

## 2013-11-07 MED ORDER — OLMESARTAN-AMLODIPINE-HCTZ 40-5-25 MG PO TABS: ORAL_TABLET | ORAL | Status: AC

## 2013-11-07 NOTE — Telephone Encounter (Signed)
Pt contacted about normal stress test no prior infarct or ischemia noted, however her BP was significantly elevated during the exercise portion of the test, so Dr NelsoDelton Seen recommends pt to start taking a whole pill of olmesartan-amlodipine-hctz 40-5-25mg  tabs instead of a 1/2.  Pt verbalized understanding and agrees with this plan.  Pt stated she has plenty of refills on this med, and will notify us when she needs a refill.  Updated med list accordingly.

## 2013-11-07 NOTE — Telephone Encounter (Signed)
Message copied by Loa SocksMARTIN, IVY M on Tue Nov 07, 2013  8:49 AM ------      Message from: Lars MassonNELSON, KATARINA H      Created: Mon Nov 06, 2013  5:37 PM       Normal stress test with no prior infarct or ischemia, however significantly increased blood pressure during exercise, I would therefore recommend to take a full pill of Olmesartan-Amlodipine-HCTZ (TRIBENZOR) 40-5-25 MG TABS. She is currently taking 1/2 pill.        ------

## 2013-11-14 ENCOUNTER — Ambulatory Visit (INDEPENDENT_AMBULATORY_CARE_PROVIDER_SITE_OTHER): Payer: BC Managed Care – PPO | Admitting: Pharmacist

## 2013-11-14 VITALS — Wt 163.0 lb

## 2013-11-14 DIAGNOSIS — E785 Hyperlipidemia, unspecified: Secondary | ICD-10-CM

## 2013-11-14 DIAGNOSIS — Z79899 Other long term (current) drug therapy: Secondary | ICD-10-CM

## 2013-11-14 MED ORDER — ATORVASTATIN CALCIUM 10 MG PO TABS: 10.0000 mg | ORAL_TABLET | Freq: Every day | ORAL | Status: DC

## 2013-11-14 NOTE — Progress Notes (Signed)
Patient is a pleasant 58 y.o. Female referred to lipid clinic by Dr. Delton SeeNelson.  She had a h/o diabetes and HTN, and had a brother recently have an MI at age 59 y.o.  She has a h/o elevated TG, and has been taking Vascepa 2 g/d.  She was recently started on fenofibrate a few months ago after TG > 300 mg/dL.  She has never taken a statin.  Her LDL is 66 mg/dL which is likely falsely low due to elevated TG, however her non-HDL is elevated at 128 mg/dL (goal < 161100).  Patient works late hours so diet can be erratic.  Patient tells me that carbohydrates are her weakness, and that she eats a lot of white starches.  She doesn't drink soda or alcohol, and drinks very little tea.  Drinks lots of water.  She is not very active, and walks maybe once weekly.  RF:  Diabetes, HTN, age, family h/o CAD - LDL goal < 70, non-HDL goal < 100 Meds:  Vascepa 2 g/d, Antara 130 mg daily  Family history:  Father died in MVA at 59 y.o. So don't know much about him, but his family appears to all have diabetes.  Her brother had an MI at 59 y.o. Social history:  Denies tobacco use.  Rarely drinks alcohol.  Labs:   08/2013:  TC 163, TG 311, LDL 66, non-HDL 128, HDL 35, LFTs normal (Vascepa 2 g/d at that time)  Current Outpatient Prescriptions  Medication Sig Dispense Refill  . fenofibrate micronized (ANTARA) 130 MG capsule Take 1 capsule (130 mg total) by mouth daily before breakfast.  30 capsule  5  . Icosapent Ethyl 1 G CAPS Take 2 capsules by mouth daily.      Marland Kitchen. levothyroxine (SYNTHROID, LEVOTHROID) 75 MCG tablet Take 1 tablet (75 mcg total) by mouth daily before breakfast.  30 tablet  11  . Olmesartan-Amlodipine-HCTZ 40-5-25 MG TABS TAKE 1 WHOLE PILL EVERY MORNING  30 tablet  6  . SitaGLIPtin-MetFORMIN HCl (JANUMET XR) 50-1000 MG TB24 Take 1 tablet by mouth 2 (two) times daily.  60 tablet  5  . nadolol (CORGARD) 20 MG tablet Take 1 tablet (20 mg total) by mouth daily.  30 tablet  11   No current facility-administered  medications for this visit.   Allergies  Allergen Reactions  . Ace Inhibitors Cough   Family History  Problem Relation Age of Onset  . Thyroid disease Mother   . Breast cancer Maternal Aunt   . Breast cancer Maternal Grandmother   . Diabetes Paternal Grandmother   . Heart disease Brother     Cardiac arrest

## 2013-11-14 NOTE — Patient Instructions (Signed)
Plan: 1.  Start generic lipitor 10 mg once daily (can take anytime) 2.  Move your fenofibrate (Anatara) 130 mg to the evening time and take after your evening meal 3.  Continue fish oil (Vascepa) 2 capsules daily. 4.  Will hope to stop fish oil or fenofibrate in 4 weeks once we get blood work back. 5.  Increase walking to 3 days per week. 6.  Limit white starches. 7.  Recheck cholesterol in 4 weeks (12/11/13 fasting labs - show up in AM anytime), and see Riki RuskJeremy 12/12/13 (3:30 pm)

## 2013-11-14 NOTE — Assessment & Plan Note (Signed)
Patient and I had discussion about cardiovascular risk reduction with statin in diabetic patients.  Her non-HDL is elevated, and LDL likely only < 70 given elevated TG (>300).  Would like to start low dose statin today, and may be able to stop either fenofibrate or Vascepa in the future.  She tells me she is taking fenofibrate on an empty stomach in the morning, so will change this to the evening after a meal to increase bioavailability.  Patient to work on limiting carbohydrates and increasing aerobic activity.  Since don't have repeat lipid since adding fenofibrate, we will recheck a lipid / liver in 4 weeks from now, and start lipitor 10 mg qd tonight.  She is agreeable to this, and she hopes to come off either fish oil or fibrate in 4 weeks.  Will see me 1 day after labs.

## 2013-12-11 ENCOUNTER — Other Ambulatory Visit (INDEPENDENT_AMBULATORY_CARE_PROVIDER_SITE_OTHER): Payer: BC Managed Care – PPO

## 2013-12-11 DIAGNOSIS — Z79899 Other long term (current) drug therapy: Secondary | ICD-10-CM

## 2013-12-11 DIAGNOSIS — E785 Hyperlipidemia, unspecified: Secondary | ICD-10-CM

## 2013-12-11 LAB — HEPATIC FUNCTION PANEL
ALT: 25 U/L (ref 0–35)
AST: 28 U/L (ref 0–37)
Albumin: 4.3 g/dL (ref 3.5–5.2)
Alkaline Phosphatase: 65 U/L (ref 39–117)
Bilirubin, Direct: 0.1 mg/dL (ref 0.0–0.3)
Total Bilirubin: 0.6 mg/dL (ref 0.2–1.2)
Total Protein: 7.5 g/dL (ref 6.0–8.3)

## 2013-12-11 LAB — LIPID PANEL
Cholesterol: 99 mg/dL (ref 0–200)
HDL: 34.3 mg/dL — ABNORMAL LOW (ref >39.00)
LDL Cholesterol: 35 mg/dL (ref 0–99)
NonHDL: 64.7
Total CHOL/HDL Ratio: 3
Triglycerides: 147 mg/dL (ref 0.0–149.0)
VLDL: 29.4 mg/dL (ref 0.0–40.0)

## 2013-12-12 ENCOUNTER — Ambulatory Visit (INDEPENDENT_AMBULATORY_CARE_PROVIDER_SITE_OTHER): Payer: BC Managed Care – PPO | Admitting: Pharmacist

## 2013-12-12 VITALS — Wt 164.0 lb

## 2013-12-12 DIAGNOSIS — E785 Hyperlipidemia, unspecified: Secondary | ICD-10-CM

## 2013-12-12 DIAGNOSIS — Z79899 Other long term (current) drug therapy: Secondary | ICD-10-CM

## 2013-12-12 NOTE — Patient Instructions (Signed)
1.  Stop Vascepa (fish oil) 2.  Make sure to take Antara (fenofibrate) with largest meal of the day. 3.  Continue generic lipitor 10 mg once daily 4.  Limit sugary drinks, carbohydrates, and desserts in diet. 5.  Start walking 4-5 days per week, with weight loss goal of 10 lbs over next 6 months. 6.  Recheck cholesterol and liver in 6 months (06/24/14 - fasting labs), and see Riki RuskJeremy the following day 06/25/14 at 3:30 pm

## 2013-12-12 NOTE — Progress Notes (Signed)
Patient is a pleasant 58 y.o. Female referred to lipid clinic by Dr. Delton SeeNelson.  She has a h/o diabetes and HTN, and had a brother recently have an MI at age 59 y.o.  She has a h/o elevated TG (>500), and has been taking Vascepa 2 g/d, Antara 130 mg qd, and Lipitor 10 mg qd.  Atorvastatin was started 4 weeks ago, and tolerating this well.  Her LDL is now down to 35 mg/dL and TG now < 409150 mg/qdL.     Patient works late hours so diet can be erratic.  Patient tells me that carbohydrates are her weakness, and that she eats a lot of white starches.  She doesn't drink soda or alcohol, and drinks very little tea.  Drinks lots of water.  She is not very active, and walks maybe once weekly.  RF:  Diabetes, HTN, age, family h/o CAD - LDL goal < 70, non-HDL goal < 100 Meds:  Vascepa 2 g/d, Antara 130 mg daily, Lipitor 10 mg qd  Family history:  Father died in MVA at 59 y.o. So don't know much about him, but his family appears to all have diabetes.  Her brother had an MI at 59 y.o. Social history:  Denies tobacco use.  Rarely drinks alcohol.  Labs:   11/2013:  TC 99, TG 147, LDL 35, non-HDL 65, HDL 34, LFTs normal (Vascepa 2 g/d, Antara 130 mg qd, Lipitor 10 mg qd) 08/2013:  TC 163, TG 311, LDL 66, non-HDL 128, HDL 35, LFTs normal (Vascepa 2 g/d at that time)  Current Outpatient Prescriptions  Medication Sig Dispense Refill  . atorvastatin (LIPITOR) 10 MG tablet Take 1 tablet (10 mg total) by mouth daily.  30 tablet  5  . fenofibrate micronized (ANTARA) 130 MG capsule Take 1 capsule (130 mg total) by mouth daily before breakfast.  30 capsule  5  . Icosapent Ethyl 1 G CAPS Take 2 capsules by mouth daily.      Marland Kitchen. levothyroxine (SYNTHROID, LEVOTHROID) 75 MCG tablet Take 1 tablet (75 mcg total) by mouth daily before breakfast.  30 tablet  11  . Olmesartan-Amlodipine-HCTZ 40-5-25 MG TABS TAKE 1 WHOLE PILL EVERY MORNING  30 tablet  6  . SitaGLIPtin-MetFORMIN HCl (JANUMET XR) 50-1000 MG TB24 Take 1 tablet by mouth 2 (two)  times daily.  60 tablet  5  . nadolol (CORGARD) 20 MG tablet Take 1 tablet (20 mg total) by mouth daily.  30 tablet  11   No current facility-administered medications for this visit.   Allergies  Allergen Reactions  . Ace Inhibitors Cough   Family History  Problem Relation Age of Onset  . Thyroid disease Mother   . Breast cancer Maternal Aunt   . Breast cancer Maternal Grandmother   . Diabetes Paternal Grandmother   . Heart disease Brother     Cardiac arrest

## 2013-12-12 NOTE — Assessment & Plan Note (Addendum)
Excellent drop in non-HDL and TG over past 4 weeks since adding fibrate and lipitor 10 mg qd.  Patient wants to try to get off meds if possible, which I feel is reasonable now.  Will continue lipitor given macrovascular benefit in diabetes, and will continue fenofibrate given h/o TG > 500 and for its microvascular benefit in diabetics.  Will have her d/c Vascepa today, and will recheck lipid / liver in 6 months.  Patient agrees to start walking and work on weight loss, in hopes that TG won't trend back up once off Vascepa.  If non-HDL and TG up in 6 months, will consider restarting fish oil or titrating lipitor at that time.   Plan: 1.  Stop Vascepa (fish oil) 2.  Make sure to take Antara (fenofibrate) with largest meal of the day. 3.  Continue generic lipitor 10 mg once daily 4.  Limit sugary drinks, carbohydrates, and desserts in diet. 5.  Start walking 4-5 days per week, with weight loss goal of 10 lbs over next 6 months. 6.  Recheck cholesterol and liver in 6 months (06/24/14 - fasting labs), and see Riki RuskJeremy the following day 06/25/14 at 3:30 pm

## 2013-12-17 ENCOUNTER — Other Ambulatory Visit: Payer: Self-pay | Admitting: Family Medicine

## 2013-12-17 DIAGNOSIS — I10 Essential (primary) hypertension: Secondary | ICD-10-CM

## 2013-12-17 MED ORDER — HYDROCHLOROTHIAZIDE 25 MG PO TABS: 25.0000 mg | ORAL_TABLET | Freq: Every day | ORAL | Status: AC

## 2013-12-27 ENCOUNTER — Other Ambulatory Visit: Payer: Self-pay | Admitting: Family Medicine

## 2014-06-25 ENCOUNTER — Ambulatory Visit: Payer: BC Managed Care – PPO | Admitting: Pharmacist

## 2014-06-25 ENCOUNTER — Other Ambulatory Visit: Payer: BC Managed Care – PPO

## 2014-06-26 ENCOUNTER — Ambulatory Visit: Payer: BLUE CROSS/BLUE SHIELD | Admitting: Pharmacist

## 2014-07-23 ENCOUNTER — Telehealth: Payer: Self-pay | Admitting: Cardiology

## 2014-07-23 ENCOUNTER — Other Ambulatory Visit (INDEPENDENT_AMBULATORY_CARE_PROVIDER_SITE_OTHER): Payer: BLUE CROSS/BLUE SHIELD | Admitting: *Deleted

## 2014-07-23 ENCOUNTER — Other Ambulatory Visit: Payer: Self-pay

## 2014-07-23 DIAGNOSIS — E785 Hyperlipidemia, unspecified: Secondary | ICD-10-CM

## 2014-07-23 DIAGNOSIS — Z1231 Encounter for screening mammogram for malignant neoplasm of breast: Secondary | ICD-10-CM

## 2014-07-23 DIAGNOSIS — Z79899 Other long term (current) drug therapy: Secondary | ICD-10-CM

## 2014-07-23 LAB — LIPID PANEL
Cholesterol: 117 mg/dL (ref 0–200)
HDL: 38.7 mg/dL — ABNORMAL LOW (ref >39.00)
LDL Cholesterol: 50 mg/dL (ref 0–99)
NonHDL: 78.3
Total CHOL/HDL Ratio: 3
Triglycerides: 144 mg/dL (ref 0.0–149.0)
VLDL: 28.8 mg/dL (ref 0.0–40.0)

## 2014-07-23 LAB — HEPATIC FUNCTION PANEL
ALT: 23 U/L (ref 0–35)
AST: 33 U/L (ref 0–37)
Albumin: 4.3 g/dL (ref 3.5–5.2)
Alkaline Phosphatase: 86 U/L (ref 39–117)
Bilirubin, Direct: 0.1 mg/dL (ref 0.0–0.3)
Total Bilirubin: 0.4 mg/dL (ref 0.2–1.2)
Total Protein: 7 g/dL (ref 6.0–8.3)

## 2014-07-23 MED ORDER — ATORVASTATIN CALCIUM 10 MG PO TABS: 10.0000 mg | ORAL_TABLET | Freq: Every day | ORAL | Status: AC

## 2014-07-23 NOTE — Telephone Encounter (Signed)
New Msg ° ° ° ° ° ° °Pt returning call. ° ° °Please call back. °

## 2014-07-23 NOTE — Telephone Encounter (Signed)
Informed the pt that per Dr Delton SeeNelson her lab results showed normal electrolytes and kidney function, and her triglycerides have significantly improved.  Clarification order obtained by Dr Delton SeeNelson in regards to the pt taking atorvastatin 10 mg po daily or increase this regimen.  Per Dr Delton SeeNelson this pt should remain on her current regimen of atorvastatin 10 mg po daily, due to guidelines stating that any pt over the age of 60 yrs old and with DM should be on a statin.  Pt requesting a refill on her atorvastatin.  Sent refill to pharmacy of choice.  Pt verbalized understanding and agrees with this plan.

## 2014-07-23 NOTE — Telephone Encounter (Signed)
-----   Message from Lars MassonKatarina H Nelson, MD sent at 07/23/2014  3:57 PM EDT ----- Normal electrolytes and kidney function, her triglycerides have improved significantly. However based on guideline she needs to be on statins as well - for any patients over 204 years old and with DM. I would add atorvastatin 10 mg po daily and recheck CMP in 1 month.

## 2014-07-23 NOTE — Addendum Note (Signed)
Addended by: Tonita PhoenixBOWDEN, ROBIN K on: 07/23/2014 08:36 AM   Modules accepted: Orders

## 2014-07-25 ENCOUNTER — Ambulatory Visit: Payer: BLUE CROSS/BLUE SHIELD | Admitting: Pharmacist

## 2014-08-03 ENCOUNTER — Encounter (INDEPENDENT_AMBULATORY_CARE_PROVIDER_SITE_OTHER): Payer: Self-pay

## 2014-08-03 ENCOUNTER — Ambulatory Visit
Admission: RE | Admit: 2014-08-03 | Discharge: 2014-08-03 | Disposition: A | Discharge status: Home / Self Care | Payer: BLUE CROSS/BLUE SHIELD | Source: Ambulatory Visit

## 2014-08-03 DIAGNOSIS — Z1231 Encounter for screening mammogram for malignant neoplasm of breast: Secondary | ICD-10-CM

## 2015-06-11 ENCOUNTER — Telehealth: Payer: Self-pay | Admitting: Cardiology

## 2015-06-11 NOTE — Telephone Encounter (Signed)
New message     Pt c/o of Chest Pain: STAT if CP now or developed within 24 hours  1. Are you having CP right now? no 2. Are you experiencing any other symptoms (ex. SOB, nausea, vomiting, sweating)? This am nausea and vomited several times, headache 3. How long have you been experiencing CP? Over the weekend pt had chest pain--she continued on with her day 4. Is your CP continuous or coming and going? Pt does not remember 5. Have you taken Nitroglycerin? No ?Her PCP suggest she call the cardiologist.  Pt did take a baby aspirin this am.  Her PCP thinks it may be viral but suggested she call us.

## 2015-06-11 NOTE — Telephone Encounter (Signed)
Pt calling in to our office, as instructed by her PCP, to inform Dr Delton See of her current symptoms:  Pt complains of having nausea, and vomiting several times this morning, while at work.  Pt states she had no chest pain, sob, DOE, palpitations, diaphoresis, pre-syncopal, or syncopal episodes during her "n/v episodes." Pt states that she did take a baby aspirin this morning, for months ago her PCP instructed her to take this daily for preventative measures.  Pt states there was no cardiac issues that warranted her to take the ASA.  Pt states that after she vomited several times at work, she started to feel a little light-headed and somewhat weak.  Pt states she had not eaten or drink anything at all this morning.  Pt did call her PCP for further recommendations.  Pt stated that her PCP asked about the same questions as I did, and she did mention that over the weekend, she had just a small twinge of chest pain, that lasted only seconds.  Pt states this was the only time this occurred.   Pts PCP seems to think that her symptoms are viral in nature, but advised that the pt follow-up with her Cardiologist to make [place] aware of her complaints.  Pt states her PCP informed her that she may need an EKG done. Pt states she is currently at home and resting comfortable.  Pt states she is completely asymptomatic at this time, just very thirsty and her stomach still hurts.  Noted in the pts chart that she is borderline diabetes type II.  Asked the pt if she has followed up on this.  Pt states she has not had her pre-diabetes followed up on, in quite sometime.  Pt states she does not have a blood sugar machine at home.  Pt states she hasn't been very compliant with having her pre-diabetes and her lipids followed.  Pt states she has not taken her cholesterol med in a long time.  Informed the pt that Dr Delton See is out of the office the rest of the day, and being she is not acute at this time, I advised her to contact her  PCP now and see if she can come in for an OV to check multiple systems.  Informed the pt that her PCP could check her BS (for that could have caused her issues), have her V/S taken, check needed follow-up labs, and possibly do an EKG if needed.  Informed the pt that her EKG can be faxed to our office, for Cardiologist to review, if her PCP did not feel comfortable interpreting this.  Informed the pt that I will most definitely make Dr Delton See aware of this conversation, and follow-up with her thereafter with any new recommendations she may provide.  Pt verbalized understanding and agrees with this plan.  Pt states she feels better right now, and doesn't feel like she needs to go to see her PCP or an Urgent Care, but she did verbalize that she will call her PCP today.

## 2015-06-12 NOTE — Telephone Encounter (Signed)
Please schedule her to see one of our PAs in 2 weeks, by then we should know if her symptoms were viral or cardiac, if she continues having symptoms, she should be scheduled for a stress test at that time.

## 2015-06-12 NOTE — Telephone Encounter (Signed)
Notified the pt that per Dr Nelson, she recommends that tDelton Seept be seen in 2 weeks with one of our PAs, for by then we should know if her symptoms were viral or cardiac, and if she continues having symptoms, then she could be scheduled for a stress test at that time.  Scheduled the pt with Herma Carson PA-C for 06/24/15 at 0845.  Pt verbalized understanding and agrees with this plan.  Pt gracious for all the assistance provided.  Will route this message to [facility], as an [place].

## 2015-06-24 ENCOUNTER — Ambulatory Visit: Payer: BLUE CROSS/BLUE SHIELD | Admitting: Physician Assistant

## 2015-09-16 ENCOUNTER — Other Ambulatory Visit: Payer: Self-pay

## 2015-09-16 DIAGNOSIS — Z1231 Encounter for screening mammogram for malignant neoplasm of breast: Secondary | ICD-10-CM

## 2015-09-30 ENCOUNTER — Ambulatory Visit
Admission: RE | Admit: 2015-09-30 | Discharge: 2015-09-30 | Disposition: A | Discharge status: Home / Self Care | Payer: BLUE CROSS/BLUE SHIELD | Source: Ambulatory Visit

## 2015-09-30 DIAGNOSIS — Z1231 Encounter for screening mammogram for malignant neoplasm of breast: Secondary | ICD-10-CM

## 2016-11-23 ENCOUNTER — Other Ambulatory Visit: Payer: Self-pay | Admitting: Family Medicine

## 2016-11-23 DIAGNOSIS — Z1231 Encounter for screening mammogram for malignant neoplasm of breast: Secondary | ICD-10-CM

## 2016-11-27 ENCOUNTER — Ambulatory Visit
Admission: RE | Admit: 2016-11-27 | Discharge: 2016-11-27 | Disposition: A | Discharge status: Home / Self Care | Payer: BLUE CROSS/BLUE SHIELD | Source: Ambulatory Visit | Attending: Family Medicine | Admitting: Family Medicine

## 2016-11-27 DIAGNOSIS — Z1231 Encounter for screening mammogram for malignant neoplasm of breast: Secondary | ICD-10-CM

## 2018-04-13 ENCOUNTER — Other Ambulatory Visit: Payer: Self-pay | Admitting: Family Medicine

## 2018-04-13 DIAGNOSIS — Z1231 Encounter for screening mammogram for malignant neoplasm of breast: Secondary | ICD-10-CM

## 2018-05-12 ENCOUNTER — Ambulatory Visit
Admission: RE | Admit: 2018-05-12 | Discharge: 2018-05-12 | Disposition: A | Discharge status: Home / Self Care | Payer: BLUE CROSS/BLUE SHIELD | Source: Ambulatory Visit | Attending: Family Medicine | Admitting: Family Medicine

## 2018-05-12 DIAGNOSIS — Z1231 Encounter for screening mammogram for malignant neoplasm of breast: Secondary | ICD-10-CM

## 2018-05-13 ENCOUNTER — Other Ambulatory Visit: Payer: Self-pay | Admitting: Family Medicine

## 2018-05-13 DIAGNOSIS — R928 Other abnormal and inconclusive findings on diagnostic imaging of breast: Secondary | ICD-10-CM

## 2018-05-18 ENCOUNTER — Ambulatory Visit: Payer: BLUE CROSS/BLUE SHIELD

## 2018-05-18 ENCOUNTER — Ambulatory Visit
Admission: RE | Admit: 2018-05-18 | Discharge: 2018-05-18 | Disposition: A | Discharge status: Home / Self Care | Payer: BLUE CROSS/BLUE SHIELD | Source: Ambulatory Visit | Attending: Family Medicine | Admitting: Family Medicine

## 2018-05-18 DIAGNOSIS — R928 Other abnormal and inconclusive findings on diagnostic imaging of breast: Secondary | ICD-10-CM

## 2019-06-16 ENCOUNTER — Other Ambulatory Visit: Payer: Self-pay | Admitting: Family Medicine

## 2019-06-16 DIAGNOSIS — Z1231 Encounter for screening mammogram for malignant neoplasm of breast: Secondary | ICD-10-CM

## 2019-07-12 ENCOUNTER — Ambulatory Visit: Payer: BLUE CROSS/BLUE SHIELD

## 2019-08-02 ENCOUNTER — Other Ambulatory Visit: Payer: Self-pay

## 2019-08-02 ENCOUNTER — Ambulatory Visit
Admission: RE | Admit: 2019-08-02 | Discharge: 2019-08-02 | Disposition: A | Discharge status: Home / Self Care | Payer: BC Managed Care – PPO | Source: Ambulatory Visit | Attending: Family Medicine | Admitting: Family Medicine

## 2019-08-02 DIAGNOSIS — Z1231 Encounter for screening mammogram for malignant neoplasm of breast: Secondary | ICD-10-CM

## 2020-07-21 IMAGING — MG DIGITAL SCREENING BILATERAL MAMMOGRAM WITH CAD
4 series · 4 of 4 positions shown · non-contrast
Comparison: Previous exam(s).

CLINICAL DATA: Screening.

EXAM:
DIGITAL SCREENING BILATERAL MAMMOGRAM WITH CAD

[R CC]
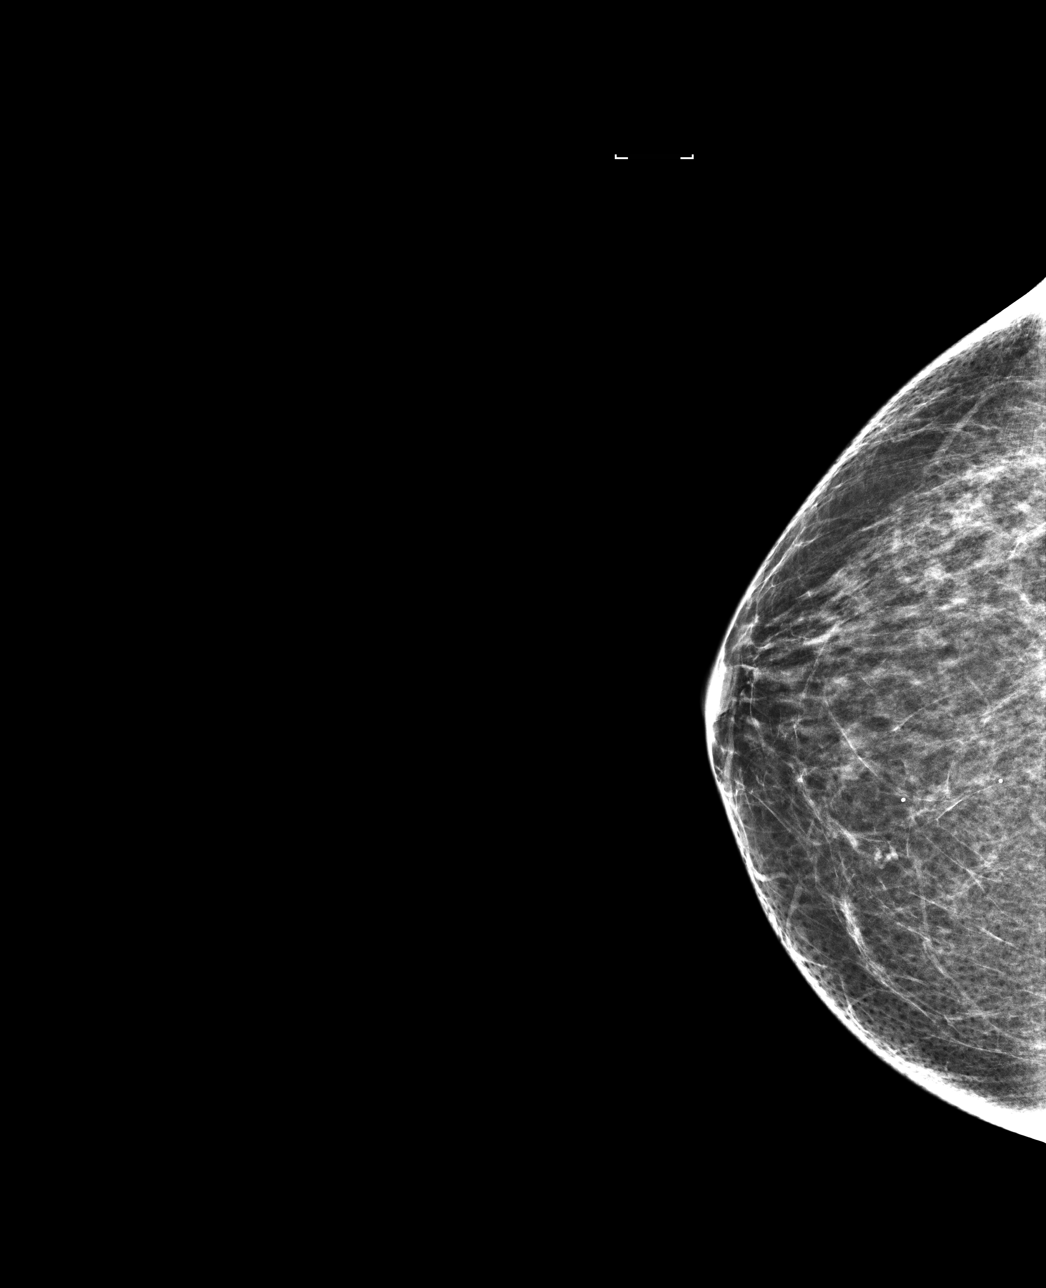

[L CC]
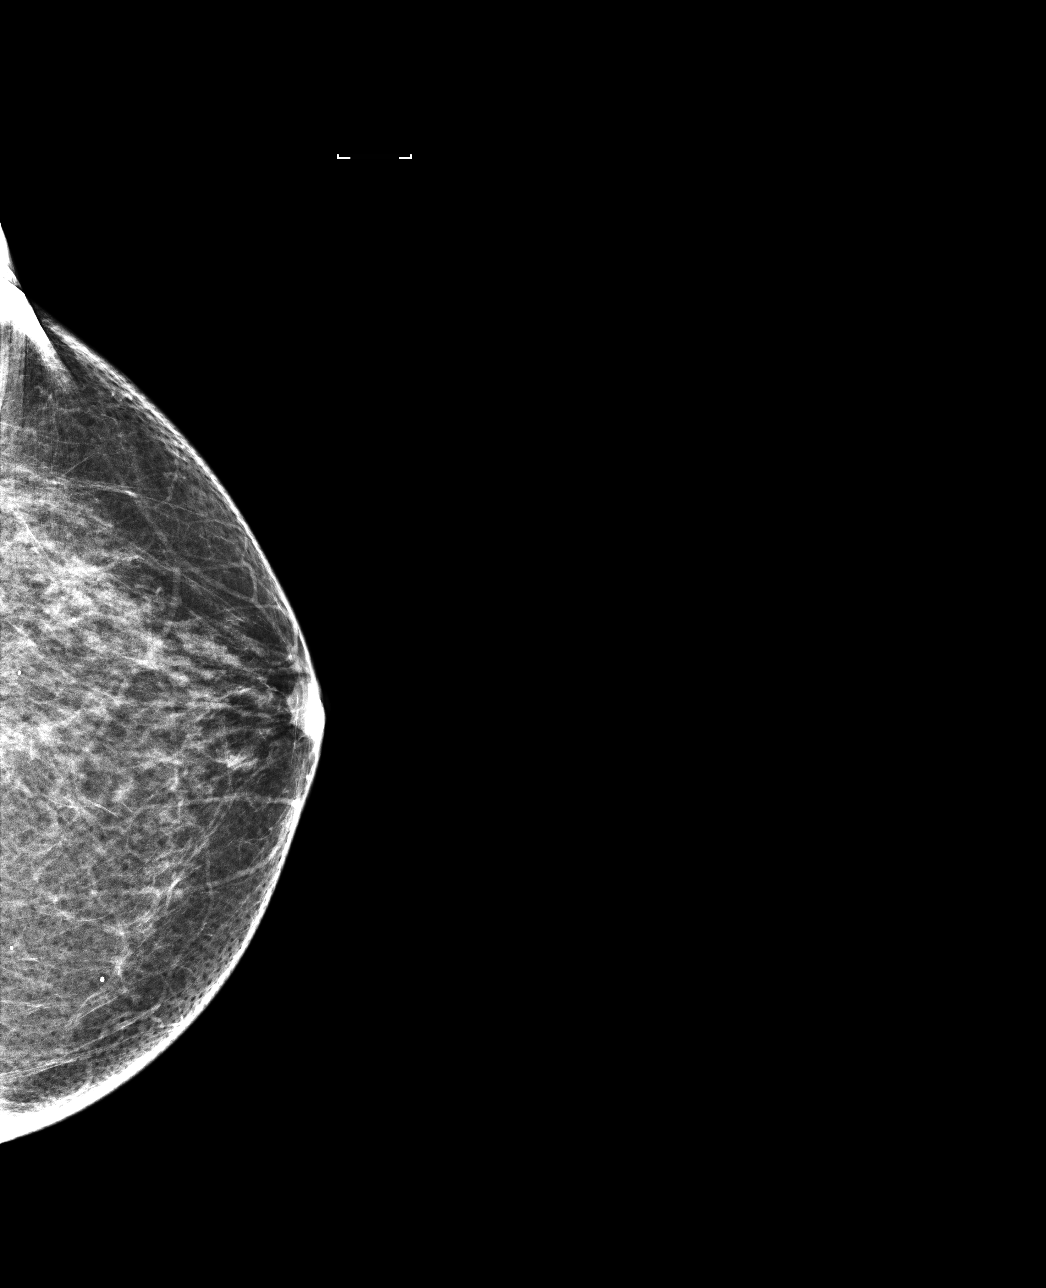

[R MLO]
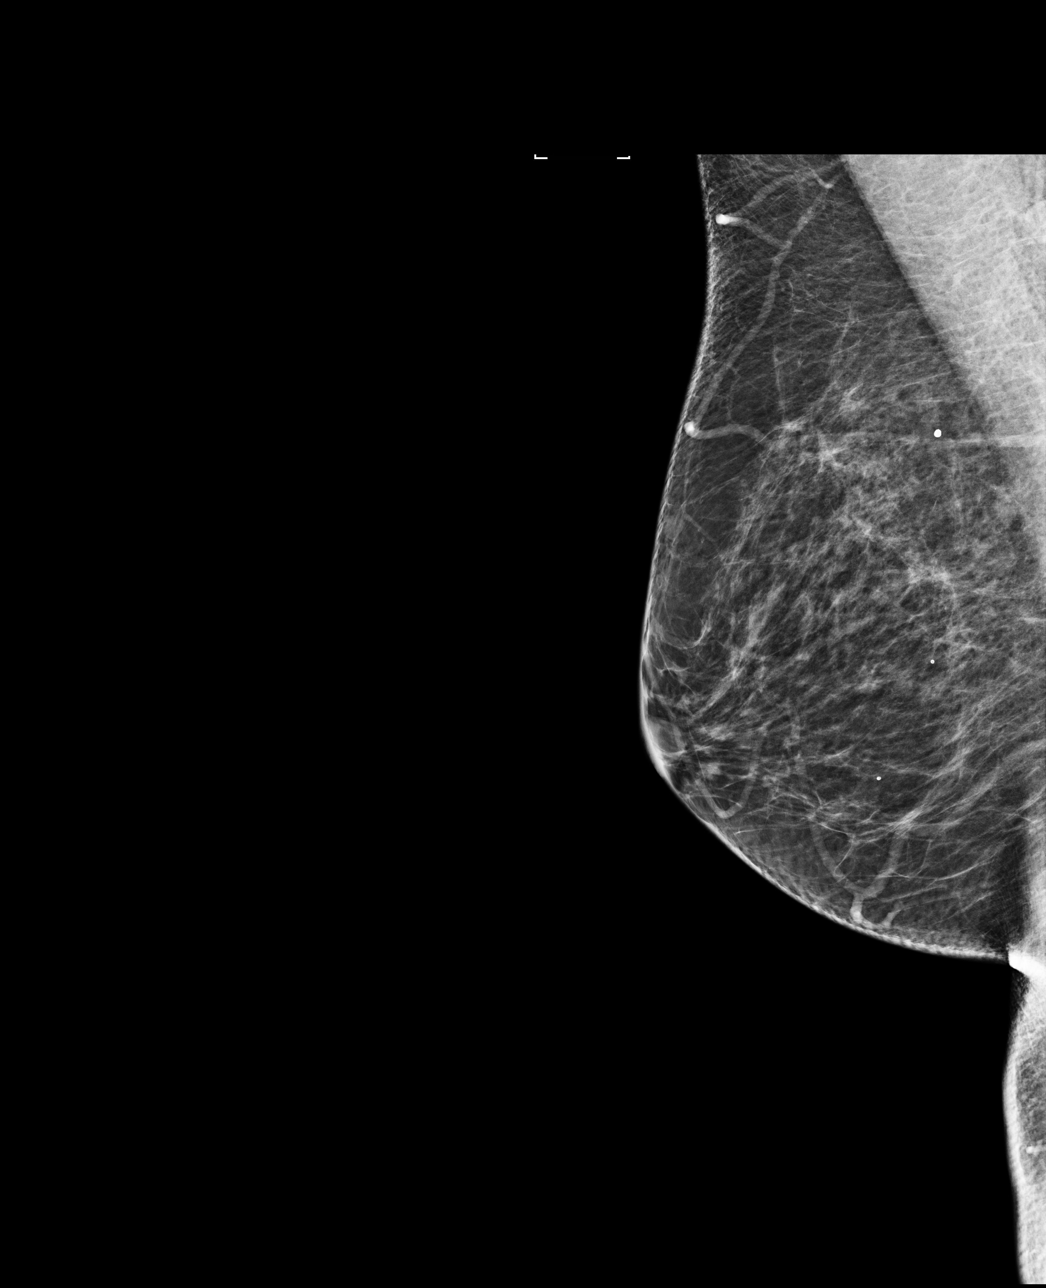

[L MLO]
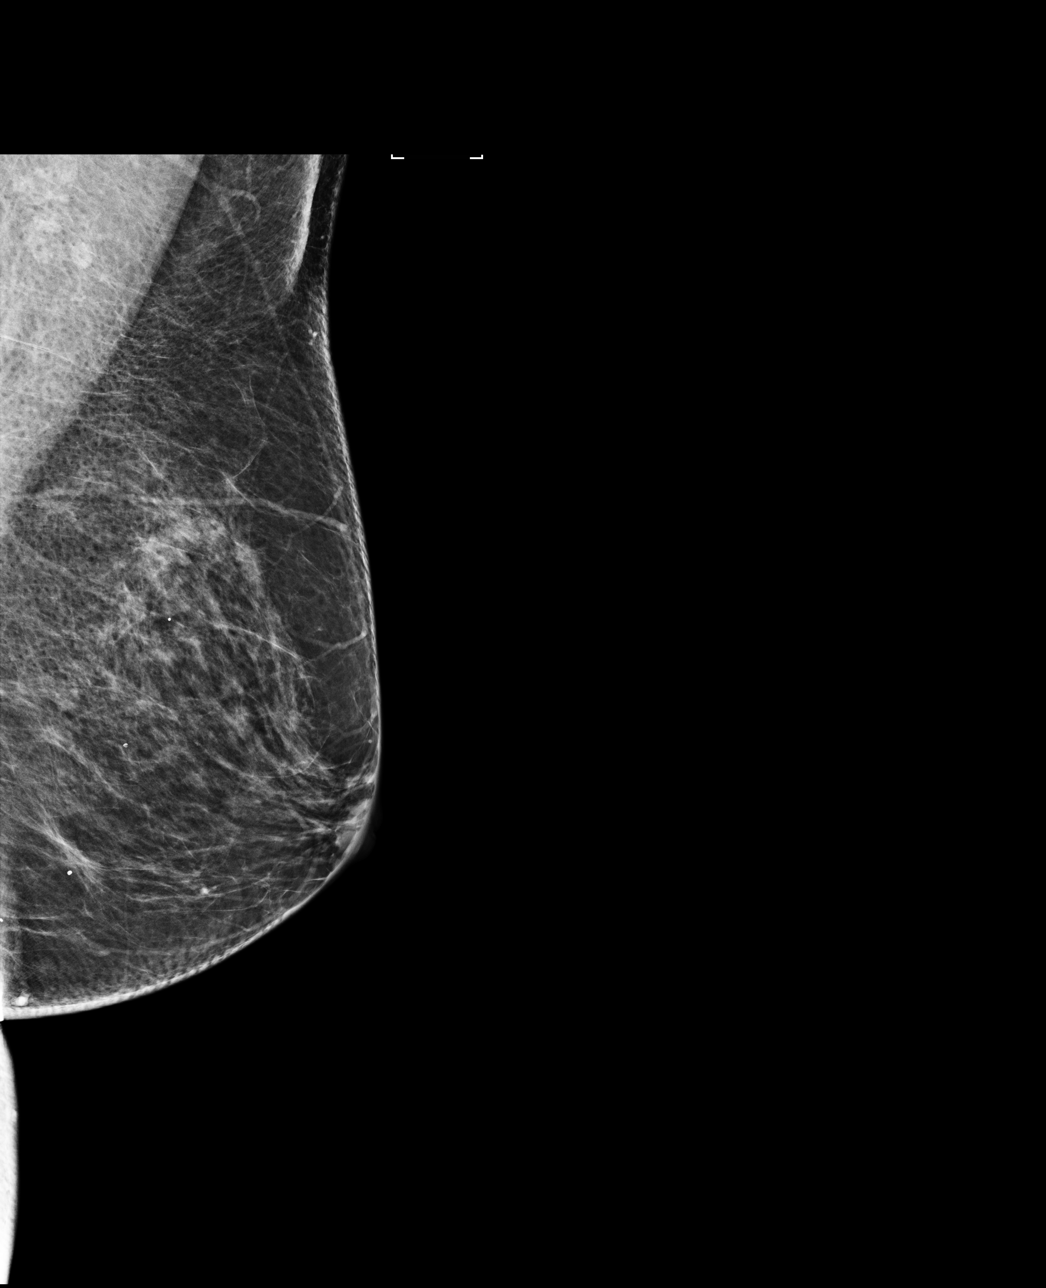

[4 of 4 positions shown; findings below may reference images not displayed]

ACR Breast Density Category b: There are scattered areas of
fibroglandular density.
FINDINGS: In the left breast, possible distortion warrants further evaluation.
This possible subtle architectural distortion is seen within the
upper LEFT breast, at posterior depth, possible correlate within the
outer LEFT breast on the CC view.

In the right breast, no findings suspicious for malignancy. Images
were processed with CAD.
IMPRESSION: Further evaluation is suggested for possible distortion in the left
breast.

RECOMMENDATION:
Diagnostic mammogram and possibly ultrasound of the left breast.
(Code:[id number]-G-88L)

The patient will be contacted regarding the findings, and additional
imaging will be scheduled.

BI-RADS CATEGORY  0: Incomplete. Need additional imaging evaluation
and/or prior mammograms for comparison.

## 2020-09-17 ENCOUNTER — Other Ambulatory Visit: Payer: Self-pay | Admitting: Obstetrics and Gynecology

## 2020-09-17 DIAGNOSIS — Z1231 Encounter for screening mammogram for malignant neoplasm of breast: Secondary | ICD-10-CM

## 2020-09-23 ENCOUNTER — Ambulatory Visit: Payer: BC Managed Care – PPO

## 2020-09-23 ENCOUNTER — Other Ambulatory Visit: Payer: Self-pay

## 2020-09-23 ENCOUNTER — Ambulatory Visit
Admission: RE | Admit: 2020-09-23 | Discharge: 2020-09-23 | Disposition: A | Discharge status: Home / Self Care | Payer: Medicare Other | Source: Ambulatory Visit | Attending: Obstetrics and Gynecology | Admitting: Obstetrics and Gynecology

## 2020-09-23 DIAGNOSIS — Z1231 Encounter for screening mammogram for malignant neoplasm of breast: Secondary | ICD-10-CM

## 2021-11-14 ENCOUNTER — Other Ambulatory Visit: Payer: Self-pay | Admitting: Obstetrics and Gynecology

## 2021-11-14 DIAGNOSIS — Z1231 Encounter for screening mammogram for malignant neoplasm of breast: Secondary | ICD-10-CM

## 2021-11-27 ENCOUNTER — Ambulatory Visit: Payer: Medicare Other

## 2021-12-02 ENCOUNTER — Ambulatory Visit
Admission: RE | Admit: 2021-12-02 | Discharge: 2021-12-02 | Disposition: A | Discharge status: Home / Self Care | Payer: Medicare Other | Source: Ambulatory Visit | Attending: Obstetrics and Gynecology | Admitting: Obstetrics and Gynecology

## 2021-12-02 DIAGNOSIS — Z1231 Encounter for screening mammogram for malignant neoplasm of breast: Secondary | ICD-10-CM

## 2022-10-30 ENCOUNTER — Other Ambulatory Visit: Payer: Self-pay | Admitting: Obstetrics and Gynecology

## 2022-10-30 DIAGNOSIS — Z1231 Encounter for screening mammogram for malignant neoplasm of breast: Secondary | ICD-10-CM

## 2022-12-08 ENCOUNTER — Ambulatory Visit
Admission: RE | Admit: 2022-12-08 | Discharge: 2022-12-08 | Disposition: A | Discharge status: Home / Self Care | Payer: Medicare Other | Source: Ambulatory Visit | Attending: Obstetrics and Gynecology | Admitting: Obstetrics and Gynecology

## 2022-12-08 DIAGNOSIS — Z1231 Encounter for screening mammogram for malignant neoplasm of breast: Secondary | ICD-10-CM

## 2022-12-10 ENCOUNTER — Other Ambulatory Visit: Payer: Self-pay | Admitting: Obstetrics and Gynecology

## 2022-12-10 DIAGNOSIS — R928 Other abnormal and inconclusive findings on diagnostic imaging of breast: Secondary | ICD-10-CM

## 2022-12-16 ENCOUNTER — Ambulatory Visit: Payer: Medicare Other

## 2022-12-16 ENCOUNTER — Ambulatory Visit
Admission: RE | Admit: 2022-12-16 | Discharge: 2022-12-16 | Disposition: A | Discharge status: Home / Self Care | Payer: Medicare Other | Source: Ambulatory Visit | Attending: Obstetrics and Gynecology | Admitting: Obstetrics and Gynecology

## 2022-12-16 DIAGNOSIS — R928 Other abnormal and inconclusive findings on diagnostic imaging of breast: Secondary | ICD-10-CM

## 2023-12-13 ENCOUNTER — Other Ambulatory Visit: Payer: Self-pay | Admitting: Obstetrics and Gynecology

## 2023-12-13 DIAGNOSIS — Z1231 Encounter for screening mammogram for malignant neoplasm of breast: Secondary | ICD-10-CM

## 2023-12-29 ENCOUNTER — Ambulatory Visit
Admission: RE | Admit: 2023-12-29 | Discharge: 2023-12-29 | Disposition: A | Discharge status: Home / Self Care | Source: Ambulatory Visit | Attending: Obstetrics and Gynecology | Admitting: Obstetrics and Gynecology

## 2023-12-29 DIAGNOSIS — Z1231 Encounter for screening mammogram for malignant neoplasm of breast: Secondary | ICD-10-CM
